# Patient Record
Sex: Male | Born: 1990 | Race: White | Hispanic: No | Marital: Single | Smoking: Never smoker
Health system: Southern US, Community
[De-identification: ages and names within clinical notes are randomized; demographics above are authoritative.]

## PROBLEM LIST (undated history)

## (undated) DIAGNOSIS — F32A Depression, unspecified: Secondary | ICD-10-CM

## (undated) DIAGNOSIS — R519 Headache, unspecified: Secondary | ICD-10-CM

## (undated) DIAGNOSIS — R55 Syncope and collapse: Secondary | ICD-10-CM

## (undated) DIAGNOSIS — F329 Major depressive disorder, single episode, unspecified: Secondary | ICD-10-CM

## (undated) DIAGNOSIS — R51 Headache: Secondary | ICD-10-CM

## (undated) HISTORY — DX: Headache, unspecified: R51.9

## (undated) HISTORY — PX: EYE SURGERY: SHX253

## (undated) HISTORY — PX: WISDOM TOOTH EXTRACTION: SHX21

## (undated) HISTORY — DX: Headache: R51

## (undated) HISTORY — DX: Syncope and collapse: R55

## (undated) HISTORY — DX: Depression, unspecified: F32.A

## (undated) HISTORY — DX: Major depressive disorder, single episode, unspecified: F32.9

---
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
## 2010-07-01 ENCOUNTER — Telehealth: Payer: Self-pay | Admitting: Internal Medicine

## 2010-07-01 NOTE — Telephone Encounter (Signed)
Your pt Gerald Frazier would like for her son to become a new pt. Can I sch?

## 2010-07-04 NOTE — Telephone Encounter (Signed)
Ok. No urgency

## 2010-08-23 ENCOUNTER — Ambulatory Visit: Payer: Self-pay | Admitting: Internal Medicine

## 2010-09-20 ENCOUNTER — Ambulatory Visit: Payer: Self-pay | Admitting: Internal Medicine

## 2010-11-30 ENCOUNTER — Ambulatory Visit: Payer: Self-pay | Admitting: Internal Medicine

## 2013-06-10 ENCOUNTER — Encounter: Payer: Self-pay | Admitting: Family Medicine

## 2013-06-10 ENCOUNTER — Ambulatory Visit (INDEPENDENT_AMBULATORY_CARE_PROVIDER_SITE_OTHER): Payer: Managed Care, Other (non HMO) | Admitting: Family Medicine

## 2013-06-10 VITALS — BP 120/80 | HR 82 | Temp 98.8°F | Ht 68.0 in | Wt 214.0 lb

## 2013-06-10 DIAGNOSIS — F329 Major depressive disorder, single episode, unspecified: Secondary | ICD-10-CM

## 2013-06-10 DIAGNOSIS — F3289 Other specified depressive episodes: Secondary | ICD-10-CM

## 2013-06-10 DIAGNOSIS — F32A Depression, unspecified: Secondary | ICD-10-CM | POA: Insufficient documentation

## 2013-06-10 DIAGNOSIS — R55 Syncope and collapse: Secondary | ICD-10-CM | POA: Insufficient documentation

## 2013-06-10 DIAGNOSIS — Z7189 Other specified counseling: Secondary | ICD-10-CM

## 2013-06-10 DIAGNOSIS — Z7689 Persons encountering health services in other specified circumstances: Secondary | ICD-10-CM

## 2013-06-10 NOTE — Progress Notes (Signed)
Pre visit review using our clinic review tool, if applicable. No additional management support is needed unless otherwise documented below in the visit note. 

## 2013-06-10 NOTE — Progress Notes (Signed)
No chief complaint on file.   HPI:  Gerald Frazier is here to establish care. Has not had a Primary school teacher.  Last PCP and physical:   Has the following chronic problems and concerns today:  Depression: -stable - on 150 mg of zoloft daily -followed by psychiatrist - Dr. Drake Leach  -not getting counseling  There are no active problems to display for this patient.  Health Maintenance: wants to set up physical later  ROS: See pertinent positives and negatives per HPI.  Past Medical History  Diagnosis Date  . Depression   . Fainting spell   . Frequent headaches     Family History  Problem Relation Age of Onset  . Arthritis Mother     hand  . Hypertension Paternal Uncle   . Mental illness Paternal Uncle   . Heart disease Paternal Grandfather   . Hypertension Paternal Grandfather     History   Social History  . Marital Status: Single    Spouse Name: N/A    Number of Children: N/A  . Years of Education: N/A   Social History Main Topics  . Smoking status: Never Smoker   . Smokeless tobacco: None  . Alcohol Use: No  . Drug Use: No  . Sexual Activity: None   Other Topics Concern  . 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 , Disp: , Rfl:   EXAM:  Filed Vitals:   06/10/13 1618  BP: 120/80  Pulse: 82  Temp: 98.8 F (37.1 C)    Body mass index is 32.55  kg/(m^2).  GENERAL: vitals reviewed and listed above, alert, oriented, appears well hydrated and in no acute distress  HEENT: atraumatic, conjunttiva clear, no obvious abnormalities on inspection of external nose and ears  NECK: no obvious masses on inspection  LUNGS: clear to auscultation bilaterally, no wheezes, rales or rhonchi, good air movement  CV: HRRR, no peripheral edema  MS: moves all extremities without noticeable abnormality  PSYCH: pleasant and cooperative, no obvious depression or anxiety  ASSESSMENT AND PLAN:  Discussed the following assessment and plan:  No diagnosis found.  -We reviewed the PMH, PSH, FH, SH, Meds and Allergies. -We provided refills for any medications we will prescribe as needed. -We addressed current concerns per orders and patient instructions. -We have asked for records for pertinent exams, studies, vaccines and notes from previous providers. -We have advised patient to follow up per instructions below.   -Patient advised to return or notify a doctor immediately if symptoms worsen or persist or new concerns arise.  Patient Instructions  -PLEASE SIGN UP FOR MYCHART TODAY   We recommend the following healthy lifestyle measures: - eat a healthy diet consisting of lots of vegetables, fruits, beans, nuts, seeds, healthy  meats such as white chicken and fish and whole grains.  - avoid fried foods, fast food, processed foods, sodas, red meet and other fattening foods.  - get a least 150 minutes of aerobic exercise per week.   Follow up in: next 6 months for physical with labs - please fast for that visit bu drink plenty of water - make early morning appointment, please let the lab know about your hx of fainting with blood draws!      Lucretia Kern

## 2013-06-10 NOTE — Patient Instructions (Signed)
-  PLEASE SIGN UP FOR MYCHART TODAY   We recommend the following healthy lifestyle measures: - eat a healthy diet consisting of lots of vegetables, fruits, beans, nuts, seeds, healthy meats such as white chicken and fish and whole grains.  - avoid fried foods, fast food, processed foods, sodas, red meet and other fattening foods.  - get a least 150 minutes of aerobic exercise per week.   Follow up in: next 6 months for physical with labs - please fast for that visit bu drink plenty of water - make early morning appointment, please let the lab know about your hx of fainting with blood draws!

## 2013-07-07 ENCOUNTER — Telehealth: Payer: Self-pay | Admitting: Family Medicine

## 2013-07-07 NOTE — Telephone Encounter (Signed)
Pt needing to sch labs from his psychiatrist, but we have no idea which one. Advised pt to cb w. this info

## 2013-07-11 ENCOUNTER — Ambulatory Visit (INDEPENDENT_AMBULATORY_CARE_PROVIDER_SITE_OTHER): Payer: Managed Care, Other (non HMO) | Admitting: Family Medicine

## 2013-07-11 ENCOUNTER — Encounter: Payer: Self-pay | Admitting: Family Medicine

## 2013-07-11 VITALS — BP 116/78 | HR 70 | Temp 98.5°F | Ht 68.0 in | Wt 214.0 lb

## 2013-07-11 DIAGNOSIS — R197 Diarrhea, unspecified: Secondary | ICD-10-CM

## 2013-07-11 DIAGNOSIS — F32A Depression, unspecified: Secondary | ICD-10-CM

## 2013-07-11 DIAGNOSIS — J301 Allergic rhinitis due to pollen: Secondary | ICD-10-CM

## 2013-07-11 DIAGNOSIS — R040 Epistaxis: Secondary | ICD-10-CM

## 2013-07-11 DIAGNOSIS — F329 Major depressive disorder, single episode, unspecified: Secondary | ICD-10-CM

## 2013-07-11 DIAGNOSIS — F3289 Other specified depressive episodes: Secondary | ICD-10-CM

## 2013-07-11 MED ORDER — MUPIROCIN 2 % EX OINT: 1.0000 "application " | TOPICAL_OINTMENT | Freq: Two times a day (BID) | CUTANEOUS | Status: DC

## 2013-07-11 NOTE — Patient Instructions (Signed)
-  use the ointment 1-2 times per day gently in the anterior nose  -take claritin or zrytec daily  -let me know if diarrhea recurs, worsens or you want to see gastroenterologist  -follow up in 1-2 months

## 2013-07-11 NOTE — Progress Notes (Signed)
No chief complaint on file.   HPI:  Acute visit for:  1)Nose bleed: -had several days of nose bleeds after last visit, now resolved -had not bleeding elsewhere, no fevers, no chills, no uncontrolable or heavy bleeding, not using any medications or substances in nose -has nasal congestion - clear with allergies -started vitamin d and vit c and vit K -on high dose of sertraline and psych told him could cause nose bleed so this was decreased  2)Depression:  -stable - on 150 mg of zoloft daily; psychiatrist cut is sertraline to 100 -followed by psychiatrist - Dr. Drake Leach  -not getting counseling -doing well, no SI or worsening depression  3) loose stools: -for 3-4 months -several times per month, normal stools otherwise -denies: blood in stool, melena, mucus in stool, weight loss, abd pain, nausea, vomiting  ROS: See pertinent positives and negatives per HPI.  Past Medical History  Diagnosis Date  . Depression   . Fainting spell   . Frequent headaches     Past Surgical History  Procedure Laterality Date  . Eye surgery      strabismus  . Wisdom tooth extraction      Family History  Problem Relation Age of Onset  . Arthritis Mother     hand  . Hypertension Paternal Uncle   . Mental illness Paternal Uncle   . Heart disease Paternal Grandfather   . Hypertension Paternal Grandfather     History   Social History  . Marital Status: Single    Spouse Name: N/A    Number of Children: N/A  . Years of Education: N/A   Social History Main Topics  . Smoking status: Never Smoker   . Smokeless tobacco: None  . Alcohol Use: No  . Drug Use: No  . Sexual Activity: None   Other Topics Concern  . 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      Spiritual Beliefs: none      Lifestyle: bowls twice per week, no regular CV exercise; diet is so so - has cut out sodas              Current outpatient prescriptions:Alpha-D-Galactosidase (BEANO PO), Take by mouth as needed., Disp: , Rfl: ;  Cetirizine HCl (KLS ALLER-TEC PO), Take by mouth., Disp: , Rfl: ;  Cholecalciferol (VITAMIN D3) 2000 UNITS TABS, Take by mouth., Disp: , Rfl: ;  IBUPROFEN PO, Take by mouth as needed., Disp: , Rfl: ;  Melatonin 3 MG CAPS, Take by mouth., Disp: , Rfl: ;  Menaquinone-7 (VITAMIN K2) 100 MCG CAPS, Take by mouth., Disp: , Rfl:  sertraline (ZOLOFT) 100 MG tablet, Take 150 mg by mouth daily. , Disp: , Rfl: ;  Vitamin Mixture (ESTER-C PO), Take by mouth., Disp: , Rfl: ;  mupirocin ointment (BACTROBAN) 2 %, Place 1 application into the nose 2 (two) times daily., Disp: 22 g, Rfl: 0  EXAM:  Filed Vitals:   07/11/13 0906  BP: 116/78  Pulse: 70  Temp: 98.5 F (36.9 C)    Body mass index is 32.55 kg/(m^2).  GENERAL: vitals reviewed and listed above, alert, oriented, appears well hydrated and in no acute distress  HEENT: atraumatic, conjunttiva clear, no obvious abnormalities on inspection of external nose and ears, clear rhinorrhea and mild ant septal nasal mucosal irritation  NECK: no obvious masses on inspection  LUNGS: clear to auscultation bilaterally,  no wheezes, rales or rhonchi, good air movement  CV: HRRR, no peripheral edema  ABD: soft, NTTP  MS: moves all extremities without noticeable abnormality  PSYCH: pleasant and cooperative, no obvious depression or anxiety  ASSESSMENT AND PLAN:  Discussed the following assessment and plan:  Bleeding nose - Plan: mupirocin ointment (BACTROBAN) 2 %  Diarrhea  Depression  Allergic rhinitis due to pollen  -we discussed possible serious and likely etiologies for his symptoms, workup and treatment, treatment risks and return precautions -after this discussion, Lecil opted for tx per orders and instructions -follow up advised as scheduled  -of course, we advised Zeferino  to return or notify a doctor immediately if symptoms worsen or  persist or new concerns arise. ED precautions for nose bleeds discussed.  -Patient advised to return or notify a doctor immediately if symptoms worsen or persist or new concerns arise.  Patient Instructions  -use the ointment 1-2 times per day gently in the anterior nose  -take claritin or zrytec daily  -let me know if diarrhea recurs, worsens or you want to see gastroenterologist  -follow up in 1-2 months     KIM, HANNAH R.

## 2013-07-11 NOTE — Progress Notes (Signed)
Pre visit review using our clinic review tool, if applicable. No additional management support is needed unless otherwise documented below in the visit note. 

## 2013-09-04 ENCOUNTER — Other Ambulatory Visit: Payer: Managed Care, Other (non HMO)

## 2013-09-11 ENCOUNTER — Encounter: Payer: Managed Care, Other (non HMO) | Admitting: Family Medicine

## 2013-10-23 ENCOUNTER — Encounter: Payer: Self-pay | Admitting: Family Medicine

## 2013-10-23 ENCOUNTER — Ambulatory Visit (INDEPENDENT_AMBULATORY_CARE_PROVIDER_SITE_OTHER): Payer: Managed Care, Other (non HMO) | Admitting: Family Medicine

## 2013-10-23 VITALS — BP 108/82 | HR 73 | Temp 98.6°F | Ht 68.0 in | Wt 215.5 lb

## 2013-10-23 DIAGNOSIS — F32A Depression, unspecified: Secondary | ICD-10-CM

## 2013-10-23 DIAGNOSIS — Z23 Encounter for immunization: Secondary | ICD-10-CM

## 2013-10-23 DIAGNOSIS — Z0000 Encounter for general adult medical examination without abnormal findings: Secondary | ICD-10-CM

## 2013-10-23 DIAGNOSIS — L989 Disorder of the skin and subcutaneous tissue, unspecified: Secondary | ICD-10-CM

## 2013-10-23 DIAGNOSIS — F329 Major depressive disorder, single episode, unspecified: Secondary | ICD-10-CM

## 2013-10-23 LAB — LIPID PANEL
Cholesterol: 131 mg/dL (ref 0–200)
HDL: 32.9 mg/dL — ABNORMAL LOW (ref >39.00)
LDL Cholesterol: 60 mg/dL (ref 0–99)
NonHDL: 98.1
Total CHOL/HDL Ratio: 4
Triglycerides: 192 mg/dL — ABNORMAL HIGH (ref 0.0–149.0)
VLDL: 38.4 mg/dL (ref 0.0–40.0)

## 2013-10-23 LAB — HEMOGLOBIN A1C: Hgb A1c MFr Bld: 5.6 % (ref 4.6–6.5)

## 2013-10-23 MED ORDER — SERTRALINE HCL 100 MG PO TABS: 100.0000 mg | ORAL_TABLET | Freq: Every day | ORAL | Status: DC

## 2013-10-23 NOTE — Progress Notes (Addendum)
No chief complaint on file.   HPI:  Here for CPE:  -Concerns and/or follow up today:   Depression: -his psychiatrist moved and he is having difficulty getting in with his new psychiatrist -needs refill on sertraline 100mg  daily -denies: self harm, SI, worsening  -Diet: poor  -Exercise: no CV regular exercise - active at school though  -Diabetes and Dyslipidemia Screening: lipid, hemoglobin A1c  -Hx of HTN: no  -Vaccines: flu vaccine today; he thinks tdap is utd  -sexual activity: yes, male partner, no new partners  -wants STI testing, Hep C screening (if born 54-1967): no  -FH colon or prstate ca: see FH Last colon cancer screening: n/a Last prostate ca screening: n/a  -Alcohol, Tobacco, drug use: see social history  Review of Systems - no fevers, unintentional weight loss, vision loss, hearing loss, chest pain, sob, hemoptysis, melena, hematochezia, hematuria, genital discharge, changing or concerning skin lesions, bleeding, bruising, loc, thoughts of self harm or SI Depression is well controlled - followed by psychiatrist.  Past Medical History  Diagnosis Date  . Depression   . Fainting spell   . Frequent headaches     Past Surgical History  Procedure Laterality Date  . Eye surgery      strabismus  . Wisdom tooth extraction      Family History  Problem Relation Age of Onset  . Arthritis Mother     hand  . Hypertension Paternal Uncle   . Mental illness Paternal Uncle   . Heart disease Paternal Grandfather   . Hypertension Paternal Grandfather     History   Social History  . Marital Status: Single    Spouse Name: N/A    Number of Children: N/A  . Years of Education: N/A   Social History Main Topics  . Smoking status: Never Smoker   . Smokeless tobacco: None  . Alcohol Use: No  . Drug Use: No  . Sexual Activity: None   Other Topics Concern  . 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      Spiritual Beliefs: none      Lifestyle: bowls twice per week, no regular CV exercise; diet is so so - has cut out sodas             Current outpatient prescriptions:Cetirizine HCl (KLS ALLER-TEC PO), Take by mouth., Disp: , Rfl: ;  IBUPROFEN PO, Take by mouth as needed., Disp: , Rfl: ;  Melatonin 3 MG CAPS, Take by mouth., Disp: , Rfl: ;  Multiple Vitamins-Minerals (MULTIVITAMIN PO), Take by mouth daily., Disp: , Rfl: ;  sertraline (ZOLOFT) 100 MG tablet, Take 1 tablet (100 mg total) by mouth daily., Disp: 30 tablet, Rfl: 1;  VITAMIN E PO, Take by mouth daily., Disp: , Rfl:   EXAM:  Filed Vitals:   10/23/13 0815  BP: 108/82  Pulse: 73  Temp: 98.6 F (37 C)  TempSrc: Oral  Height: 5\' 8"  (1.727 m)  Weight: 215 lb 8 oz (97.75 kg)    Estimated body mass index is 32.77 kg/(m^2) as calculated from the following:   Height as of this encounter: 5\' 8"  (1.727 m).   Weight as of this encounter: 215 lb 8 oz (97.75 kg).  GENERAL: vitals reviewed and listed below, alert, oriented, appears well hydrated and in no acute distress  HEENT: head atraumatic, PERRLA, normal appearance of eyes, ears, nose and mouth. moist  mucus membranes.  NECK: supple, no masses or lymphadenopathy  LUNGS: clear to auscultation bilaterally, no rales, rhonchi or wheeze  CV: HRRR, no peripheral edema or cyanosis, normal pedal pulses  ABDOMEN: bowel sounds normal, soft, non tender to palpation, no masses, no rebound or guarding  XM:[id number]  RECTAL: refused  SKIN: no rash, irr shape brown 8 mm oval maculopalpular lesion on mid back  MS: normal gait, moves all extremities normally  NEURO: CN II-XII grossly intact, normal muscle strength and sensation to light touch on extremities  PSYCH: normal affect, pleasant and cooperative  ASSESSMENT AND PLAN:  Discussed the following assessment and plan:  1. Visit for preventive health examination - Lipid Panel -  Hemoglobin A1c -flu vaccine -Discussed and advised all [place] preventive services health task force level A and B recommendations for age, sex and risks. -Advised at least 150 minutes of exercise per week and a healthy diet low in saturated fats and sweets and consisting of fresh fruits and vegetables, lean meats such as fish and white chicken and whole grains. -labs, studies and vaccines per orders this encounter  2. Depression - sertraline (ZOLOFT) 100 MG tablet; Take 1 tablet (100 mg total) by mouth daily.  Dispense: 30 tablet; Refill: 1  3. Skin lesion -advised removal of irr lesion on back - possibly irr sk, but would advise removal   Patient advised to return to clinic immediately if symptoms worsen or persist or new concerns.  Patient Instructions  -we sent a 2 month refill of you sertraline to your pharmacy  -please schedule a procedure visit to remove the lesion on your back as you leave today  -We have ordered labs or studies at this visit. It can take up to 1-2 weeks for results and processing. We will contact you with instructions IF your results are abnormal. Normal results will be released to your [facility]. If you have not heard from [place] or can not find your results in [facility] in 2 weeks please contact our office.  -PLEASE SIGN UP FOR MYCHART TODAY   We recommend the following healthy lifestyle measures: - eat a healthy diet consisting of lots of vegetables, fruits, beans, nuts, seeds, healthy meats such as white chicken and fish and whole grains.  - avoid fried foods, fast food, processed foods, sodas, red meet and other fattening foods.  - get a least 150 minutes of aerobic exercise per week.   Follow up in: the next 1 month for a procedure visit     Return in about 2 weeks (around 11/06/2013) for PROCEDURE visit for removal of skin lesion.   Gerald Benton R.

## 2013-10-23 NOTE — Addendum Note (Signed)
Addended by: Agnes Lawrence on: 10/23/2013 09:06 AM   Modules accepted: Orders

## 2013-10-23 NOTE — Progress Notes (Signed)
Pre visit review using our clinic review tool, if applicable. No additional management support is needed unless otherwise documented below in the visit note. 

## 2013-10-23 NOTE — Patient Instructions (Signed)
-  we sent a 2 month refill of you sertraline to your pharmacy  -please schedule a procedure visit to remove the lesion on your back as you leave today  -We have ordered labs or studies at this visit. It can take up to 1-2 weeks for results and processing. We will contact you with instructions IF your results are abnormal. Normal results will be released to your [facility]. If you have not heard from [place] or can not find your results in [facility] in 2 weeks please contact our office.  -PLEASE SIGN UP FOR MYCHART TODAY   We recommend the following healthy lifestyle measures: - eat a healthy diet consisting of lots of vegetables, fruits, beans, nuts, seeds, healthy meats such as white chicken and fish and whole grains.  - avoid fried foods, fast food, processed foods, sodas, red meet and other fattening foods.  - get a least 150 minutes of aerobic exercise per week.   Follow up in: the next 1 month for a procedure visit

## 2013-11-10 ENCOUNTER — Encounter: Payer: Self-pay | Admitting: Family Medicine

## 2013-11-10 ENCOUNTER — Encounter: Payer: Self-pay | Admitting: *Deleted

## 2013-11-10 ENCOUNTER — Ambulatory Visit (INDEPENDENT_AMBULATORY_CARE_PROVIDER_SITE_OTHER): Payer: Managed Care, Other (non HMO) | Admitting: Family Medicine

## 2013-11-10 VITALS — BP 120/80 | HR 80 | Temp 98.1°F | Ht 68.0 in | Wt 221.0 lb

## 2013-11-10 DIAGNOSIS — D229 Melanocytic nevi, unspecified: Secondary | ICD-10-CM

## 2013-11-10 DIAGNOSIS — R55 Syncope and collapse: Secondary | ICD-10-CM

## 2013-11-10 DIAGNOSIS — D239 Other benign neoplasm of skin, unspecified: Secondary | ICD-10-CM

## 2013-11-10 DIAGNOSIS — R112 Nausea with vomiting, unspecified: Secondary | ICD-10-CM

## 2013-11-10 NOTE — Progress Notes (Signed)
No chief complaint on file.   HPI:  Here for mole removal for atypical lesion on back. Denies pain, itchy, allergy to anethetic, bleeding issues, fevers.  Nausea, vomiting dizziness after explaining procedure. Reports this has happened in the past with procedures, labs, etc.  ROS: See pertinent positives and negatives per HPI.  Past Medical History  Diagnosis Date  . Depression   . Fainting spell   . Frequent headaches     Past Surgical History  Procedure Laterality Date  . Eye surgery      strabismus  . Wisdom tooth extraction      Family History  Problem Relation Age of Onset  . Arthritis Mother     hand  . Hypertension Paternal Uncle   . Mental illness Paternal Uncle   . Heart disease Paternal Grandfather   . Hypertension Paternal Grandfather     History   Social History  . Marital Status: Single    Spouse Name: N/A    Number of Children: N/A  . Years of Education: N/A   Social History Main Topics  . Smoking status: Never Smoker   . Smokeless tobacco: None  . Alcohol Use: No  . Drug Use: No  . Sexual Activity: None   Other Topics Concern  . 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      Spiritual Beliefs: none      Lifestyle: bowls twice per week, no regular CV exercise; diet is so so - has cut out sodas             Current outpatient prescriptions:Cetirizine HCl (KLS ALLER-TEC PO), Take by mouth., Disp: , Rfl: ;  Cholecalciferol (VITAMIN D PO), Take by mouth daily., Disp: , Rfl: ;  IBUPROFEN PO, Take by mouth as needed., Disp: , Rfl: ;  Melatonin 3 MG CAPS, Take by mouth., Disp: , Rfl: ;  Multiple Vitamins-Minerals (MULTIVITAMIN PO), Take by mouth daily., Disp: , Rfl:  sertraline (ZOLOFT) 100 MG tablet, Take 1 tablet (100 mg total) by mouth daily., Disp: 30 tablet, Rfl: 1  EXAM:  Filed Vitals:   11/10/13 1329  BP: 120/80  Pulse: 80  Temp: 98.1 F (36.7 C)     Body mass index is 33.61 kg/(m^2).  GENERAL: vitals reviewed and listed above, alert, oriented, appears well hydrated and in no acute distress  SKIN: 55mm irr colored maculopapular lesion on mid back  MS: moves all extremities without noticeable abnormality  PSYCH: pleasant and cooperative, no obvious depression or anxiety  ASSESSMENT AND PLAN:  Discussed the following assessment and plan:  Atypical nevus  Pre-syncope  Non-intractable vomiting with nausea, vomiting of unspecified type vs ssk, vs nevus, vs neoplasm vs other  Procedure - Shave biopst Informed consent obtained after explanation of risks/benefits and other options. Time out. Area cleaned with betadine. Local anesthesia with lidocaine 1% with epi. Shave removal of lesion. Hemostasis with drysol. Tolerated well. Specimen sent for path. Wound care instructions provided. Will contact pt with path results. Of note, he did become nauseous and lightheaded discussing procedure - report this happens to him frequently with needles, labs, etc. Monitored and placed in supine position and resolved and he wanted to proceed with procedure. -Patient advised to return or notify a doctor immediately if symptoms worsen or persist or new concerns arise.  Patient Instructions  -We have ordered labs or studies at this visit. It  can take up to 1-2 weeks for results and processing. We will contact you with instructions IF your results are abnormal. Normal results will be released to your [facility]. If you have not heard from [place] or can not find your results in [facility] in 2 weeks please contact our office.            Colin Benton R.

## 2013-11-10 NOTE — Addendum Note (Signed)
Addended by: Agnes Lawrence on: 11/10/2013 02:13 PM   Modules accepted: Orders

## 2013-11-10 NOTE — Patient Instructions (Signed)
-  We have ordered labs or studies at this visit. It can take up to 1-2 weeks for results and processing. We will contact you with instructions IF your results are abnormal. Normal results will be released to your MYCHART. If you have not heard from us or can not find your results in MYCHART in 2 weeks please contact our office.        

## 2013-11-10 NOTE — Progress Notes (Signed)
Pre visit review using our clinic review tool, if applicable. No additional management support is needed unless otherwise documented below in the visit note. 

## 2013-12-26 ENCOUNTER — Telehealth: Payer: Self-pay | Admitting: Family Medicine

## 2013-12-26 DIAGNOSIS — F329 Major depressive disorder, single episode, unspecified: Secondary | ICD-10-CM

## 2013-12-26 DIAGNOSIS — F32A Depression, unspecified: Secondary | ICD-10-CM

## 2013-12-26 MED ORDER — SERTRALINE HCL 100 MG PO TABS: 100.0000 mg | ORAL_TABLET | Freq: Every day | ORAL | Status: DC

## 2013-12-26 NOTE — Telephone Encounter (Signed)
Pt request refill sertraline (ZOLOFT) 100 MG tablet [facility]/ [address]

## 2013-12-26 NOTE — Telephone Encounter (Signed)
Rx done. 

## 2014-02-27 ENCOUNTER — Telehealth: Payer: Self-pay | Admitting: Family Medicine

## 2014-02-27 DIAGNOSIS — F32A Depression, unspecified: Secondary | ICD-10-CM

## 2014-02-27 DIAGNOSIS — F329 Major depressive disorder, single episode, unspecified: Secondary | ICD-10-CM

## 2014-02-27 MED ORDER — SERTRALINE HCL 100 MG PO TABS: 100.0000 mg | ORAL_TABLET | Freq: Every day | ORAL | Status: DC

## 2014-02-27 NOTE — Telephone Encounter (Signed)
Pt request refill  sertraline (ZOLOFT) 100 MG tablet [facility] [address]

## 2014-02-27 NOTE — Telephone Encounter (Signed)
Rx done. 

## 2014-04-01 ENCOUNTER — Other Ambulatory Visit: Payer: Self-pay | Admitting: Family Medicine

## 2014-04-02 ENCOUNTER — Other Ambulatory Visit: Payer: Self-pay | Admitting: Family Medicine

## 2014-04-03 ENCOUNTER — Encounter: Payer: Self-pay | Admitting: Family Medicine

## 2014-04-03 ENCOUNTER — Ambulatory Visit (INDEPENDENT_AMBULATORY_CARE_PROVIDER_SITE_OTHER): Payer: Managed Care, Other (non HMO) | Admitting: Family Medicine

## 2014-04-03 VITALS — BP 110/70 | HR 79 | Temp 98.5°F | Ht 68.0 in | Wt 217.4 lb

## 2014-04-03 DIAGNOSIS — F329 Major depressive disorder, single episode, unspecified: Secondary | ICD-10-CM

## 2014-04-03 DIAGNOSIS — F32A Depression, unspecified: Secondary | ICD-10-CM

## 2014-04-03 MED ORDER — SERTRALINE HCL 100 MG PO TABS: ORAL_TABLET | ORAL | Status: DC

## 2014-04-03 NOTE — Progress Notes (Signed)
Pre visit review using our clinic review tool, if applicable. No additional management support is needed unless otherwise documented below in the visit note. 

## 2014-04-03 NOTE — Patient Instructions (Signed)
BEFORE YOU LEAVE: -follow up in 6 months  Consider counseling  We recommend the following healthy lifestyle measures: - eat a healthy diet consisting of lots of vegetables, fruits, beans, nuts, seeds, healthy meats such as white chicken and fish and whole grains.  - avoid fried foods, fast food, processed foods, sodas, red meet and other fattening foods.  - get a least 150 minutes of aerobic exercise per week.

## 2014-04-03 NOTE — Progress Notes (Signed)
  HPI:  Depression: -used to see Dr. Caryl Asp and get counseling but his provider left so wonder if I can refill his medication and advise counselor -needs refill on sertraline 100mg  daily -denies: self harm, SI, worsening  ROS: See pertinent positives and negatives per HPI.  Past Medical History  Diagnosis Date  . Depression   . Fainting spell   . Frequent headaches     Past Surgical History  Procedure Laterality Date  . Eye surgery      strabismus  . Wisdom tooth extraction      Family History  Problem Relation Age of Onset  . Arthritis Mother     hand  . Hypertension Paternal Uncle   . Mental illness Paternal Uncle   . Heart disease Paternal Grandfather   . Hypertension Paternal Grandfather     History   Social History  . Marital Status: Single    Spouse Name: N/A  . Number of Children: N/A  . Years of Education: N/A   Social History Main Topics  . Smoking status: Never Smoker   . Smokeless tobacco: Not on file  . Alcohol Use: No  . Drug Use: No  . Sexual Activity: Not on file   Other Topics Concern  . 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      Spiritual Beliefs: none      Lifestyle: bowls twice per week, no regular CV exercise; diet is so so - has cut out sodas              Current outpatient prescriptions:  .  Cetirizine HCl (KLS ALLER-TEC PO), Take by mouth., Disp: , Rfl:  .  Cholecalciferol (VITAMIN D PO), Take by mouth daily., Disp: , Rfl:  .  IBUPROFEN PO, Take by mouth as needed., Disp: , Rfl:  .  Multiple Vitamins-Minerals (MULTIVITAMIN PO), Take by mouth daily., Disp: , Rfl:  .  sertraline (ZOLOFT) 100 MG tablet, TAKE ONE TABLET BY MOUTH ONCE DAILY. NEEDS APPOINTMENT, Disp: 90 tablet, Rfl: 1  EXAM:  Filed Vitals:   04/03/14 1000  BP: 110/70  Pulse: 79  Temp: 98.5 F (36.9 C)    Body mass index is 33.06 kg/(m^2).  GENERAL: vitals reviewed and  listed above, alert, oriented, appears well hydrated and in no acute distress  HEENT: atraumatic, conjunttiva clear, no obvious abnormalities on inspection of external nose and ears  NECK: no obvious masses on inspection  LUNGS: clear to auscultation bilaterally, no wheezes, rales or rhonchi, good air movement  CV: HRRR, no peripheral edema  MS: moves all extremities without noticeable abnormality  PSYCH: pleasant and cooperative, no obvious depression or anxiety  ASSESSMENT AND PLAN:  Discussed the following assessment and plan:  Depression - Plan: sertraline (ZOLOFT) 100 MG tablet  -Patient advised to return or notify a doctor immediately if symptoms worsen or persist or new concerns arise.  Patient Instructions  BEFORE YOU LEAVE: -follow up in 6 months  Consider counseling  We recommend the following healthy lifestyle measures: - eat a healthy diet consisting of lots of vegetables, fruits, beans, nuts, seeds, healthy meats such as white chicken and fish and whole grains.  - avoid fried foods, fast food, processed foods, sodas, red meet and other fattening foods.  - get a least 150 minutes of aerobic exercise per week.       Colin Benton R.

## 2014-05-01 ENCOUNTER — Other Ambulatory Visit: Payer: Self-pay | Admitting: *Deleted

## 2014-05-01 ENCOUNTER — Encounter: Payer: Self-pay | Admitting: Family Medicine

## 2014-05-01 DIAGNOSIS — F329 Major depressive disorder, single episode, unspecified: Secondary | ICD-10-CM

## 2014-05-01 DIAGNOSIS — F32A Depression, unspecified: Secondary | ICD-10-CM

## 2014-05-01 MED ORDER — SERTRALINE HCL 100 MG PO TABS: ORAL_TABLET | ORAL | Status: DC

## 2014-05-01 NOTE — Telephone Encounter (Signed)
Rx done and the pt was informed via Mychart. 

## 2014-12-25 ENCOUNTER — Other Ambulatory Visit: Payer: Self-pay | Admitting: Family Medicine

## 2014-12-25 DIAGNOSIS — F32A Depression, unspecified: Secondary | ICD-10-CM

## 2014-12-25 DIAGNOSIS — F329 Major depressive disorder, single episode, unspecified: Secondary | ICD-10-CM

## 2014-12-25 MED ORDER — SERTRALINE HCL 100 MG PO TABS: ORAL_TABLET | ORAL | Status: DC

## 2015-04-23 ENCOUNTER — Other Ambulatory Visit: Payer: Self-pay | Admitting: Family Medicine

## 2015-04-24 ENCOUNTER — Other Ambulatory Visit: Payer: Self-pay | Admitting: Family Medicine

## 2015-05-19 ENCOUNTER — Other Ambulatory Visit: Payer: Self-pay | Admitting: Family Medicine

## 2015-05-20 ENCOUNTER — Other Ambulatory Visit: Payer: Self-pay | Admitting: Family Medicine

## 2015-05-20 MED ORDER — SERTRALINE HCL 100 MG PO TABS: 100.0000 mg | ORAL_TABLET | Freq: Every day | ORAL | Status: AC

## 2016-10-12 ENCOUNTER — Encounter: Payer: Self-pay | Admitting: Family Medicine

## 2017-02-01 ENCOUNTER — Encounter: Payer: Self-pay | Admitting: Family Medicine
